# Patient Record
Sex: Female | Born: 2000
Health system: Southern US, Community
[De-identification: ages and names within clinical notes are randomized; demographics above are authoritative.]

## PROBLEM LIST (undated history)

## (undated) DIAGNOSIS — R0600 Dyspnea, unspecified: Secondary | ICD-10-CM

## (undated) DIAGNOSIS — F909 Attention-deficit hyperactivity disorder, unspecified type: Secondary | ICD-10-CM

## (undated) DIAGNOSIS — J45990 Exercise induced bronchospasm: Secondary | ICD-10-CM

## (undated) DIAGNOSIS — E049 Nontoxic goiter, unspecified: Secondary | ICD-10-CM

## (undated) DIAGNOSIS — R6250 Unspecified lack of expected normal physiological development in childhood: Secondary | ICD-10-CM

## (undated) DIAGNOSIS — F419 Anxiety disorder, unspecified: Secondary | ICD-10-CM

## (undated) DIAGNOSIS — F32A Depression, unspecified: Secondary | ICD-10-CM

## (undated) DIAGNOSIS — R630 Anorexia: Secondary | ICD-10-CM

## (undated) DIAGNOSIS — F329 Major depressive disorder, single episode, unspecified: Secondary | ICD-10-CM

## (undated) HISTORY — DX: Nontoxic goiter, unspecified: E04.9

## (undated) HISTORY — DX: Exercise induced bronchospasm: J45.990

## (undated) HISTORY — PX: WISDOM TOOTH EXTRACTION: SHX21

## (undated) HISTORY — DX: Depression, unspecified: F32.A

## (undated) HISTORY — DX: Anorexia: R63.0

## (undated) HISTORY — DX: Attention-deficit hyperactivity disorder, unspecified type: F90.9

## (undated) HISTORY — PX: ADENOIDECTOMY: SHX5191

## (undated) HISTORY — PX: LITHOTRIPSY: SUR834

## (undated) HISTORY — DX: Unspecified lack of expected normal physiological development in childhood: R62.50

## (undated) HISTORY — PX: TONSILLECTOMY AND ADENOIDECTOMY: SHX28

## (undated) HISTORY — DX: Major depressive disorder, single episode, unspecified: F32.9

## (undated) HISTORY — DX: Anxiety disorder, unspecified: F41.9

---
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
## 2022-01-02 DIAGNOSIS — F4325 Adjustment disorder with mixed disturbance of emotions and conduct: Secondary | ICD-10-CM | POA: Diagnosis not present

## 2022-01-16 DIAGNOSIS — F4325 Adjustment disorder with mixed disturbance of emotions and conduct: Secondary | ICD-10-CM | POA: Diagnosis not present

## 2022-01-18 ENCOUNTER — Other Ambulatory Visit (HOSPITAL_COMMUNITY): Payer: Self-pay

## 2022-01-18 MED ORDER — PROPRANOLOL HCL 10 MG PO TABS
10.0000 mg | ORAL_TABLET | Freq: Two times a day (BID) | ORAL | 1 refills | Status: AC
  Filled 2022-01-18: qty 180, 90d supply, fill #0
  Filled 2022-06-03: qty 180, 90d supply, fill #1
  Filled 2022-06-06: qty 60, 30d supply, fill #1

## 2022-01-18 MED ORDER — DEXTROAMPHETAMINE SULFATE ER 15 MG PO CP24
15.0000 mg | ORAL_CAPSULE | Freq: Every day | ORAL | 0 refills | Status: DC
  Filled 2022-04-05: qty 30, 30d supply, fill #0

## 2022-01-18 MED ORDER — DEXTROAMPHETAMINE SULFATE ER 15 MG PO CP24
15.0000 mg | ORAL_CAPSULE | Freq: Every day | ORAL | 0 refills | Status: DC
  Filled 2022-01-18: qty 30, 30d supply, fill #0

## 2022-01-18 MED ORDER — DEXTROAMPHETAMINE SULFATE ER 15 MG PO CP24
15.0000 mg | ORAL_CAPSULE | Freq: Every day | ORAL | 0 refills | Status: DC
  Filled 2022-02-20: qty 30, 30d supply, fill #0

## 2022-01-18 MED ORDER — AMPHETAMINE-DEXTROAMPHETAMINE 5 MG PO TABS
1.0000 | ORAL_TABLET | Freq: Every day | ORAL | 0 refills | Status: DC
  Filled 2022-04-05: qty 30, 30d supply, fill #0

## 2022-01-18 MED ORDER — AMPHETAMINE-DEXTROAMPHETAMINE 5 MG PO TABS
1.0000 | ORAL_TABLET | Freq: Every day | ORAL | 0 refills | Status: DC
  Filled 2022-01-18: qty 30, 30d supply, fill #0

## 2022-01-18 MED ORDER — AMPHETAMINE-DEXTROAMPHETAMINE 5 MG PO TABS: 1.0000 | ORAL_TABLET | Freq: Every day | ORAL | 0 refills | Status: DC

## 2022-01-23 DIAGNOSIS — F4325 Adjustment disorder with mixed disturbance of emotions and conduct: Secondary | ICD-10-CM | POA: Diagnosis not present

## 2022-02-01 DIAGNOSIS — F4325 Adjustment disorder with mixed disturbance of emotions and conduct: Secondary | ICD-10-CM | POA: Diagnosis not present

## 2022-02-06 DIAGNOSIS — F4325 Adjustment disorder with mixed disturbance of emotions and conduct: Secondary | ICD-10-CM | POA: Diagnosis not present

## 2022-02-13 DIAGNOSIS — F4325 Adjustment disorder with mixed disturbance of emotions and conduct: Secondary | ICD-10-CM | POA: Diagnosis not present

## 2022-02-18 DIAGNOSIS — F4325 Adjustment disorder with mixed disturbance of emotions and conduct: Secondary | ICD-10-CM | POA: Diagnosis not present

## 2022-02-20 ENCOUNTER — Other Ambulatory Visit (HOSPITAL_COMMUNITY): Payer: Self-pay

## 2022-02-20 DIAGNOSIS — F4325 Adjustment disorder with mixed disturbance of emotions and conduct: Secondary | ICD-10-CM | POA: Diagnosis not present

## 2022-02-27 DIAGNOSIS — F4325 Adjustment disorder with mixed disturbance of emotions and conduct: Secondary | ICD-10-CM | POA: Diagnosis not present

## 2022-03-06 DIAGNOSIS — F4325 Adjustment disorder with mixed disturbance of emotions and conduct: Secondary | ICD-10-CM | POA: Diagnosis not present

## 2022-03-13 DIAGNOSIS — F4325 Adjustment disorder with mixed disturbance of emotions and conduct: Secondary | ICD-10-CM | POA: Diagnosis not present

## 2022-03-20 DIAGNOSIS — F4325 Adjustment disorder with mixed disturbance of emotions and conduct: Secondary | ICD-10-CM | POA: Diagnosis not present

## 2022-03-27 DIAGNOSIS — F4325 Adjustment disorder with mixed disturbance of emotions and conduct: Secondary | ICD-10-CM | POA: Diagnosis not present

## 2022-04-03 DIAGNOSIS — F4325 Adjustment disorder with mixed disturbance of emotions and conduct: Secondary | ICD-10-CM | POA: Diagnosis not present

## 2022-04-05 ENCOUNTER — Other Ambulatory Visit (HOSPITAL_COMMUNITY): Payer: Self-pay

## 2022-04-05 MED ORDER — DEXTROAMPHETAMINE SULFATE ER 15 MG PO CP24
15.0000 mg | ORAL_CAPSULE | Freq: Every day | ORAL | 0 refills | Status: DC
  Filled 2022-07-08: qty 30, 30d supply, fill #0

## 2022-04-05 MED ORDER — DEXTROAMPHETAMINE SULFATE ER 15 MG PO CP24
15.0000 mg | ORAL_CAPSULE | Freq: Every day | ORAL | 0 refills | Status: DC
  Filled 2022-05-03: qty 30, 30d supply, fill #0

## 2022-04-05 MED ORDER — DEXTROAMPHETAMINE SULFATE ER 15 MG PO CP24
15.0000 mg | ORAL_CAPSULE | Freq: Every day | ORAL | 0 refills | Status: DC
  Filled 2022-06-03 – 2022-06-06 (×2): qty 30, 30d supply, fill #0

## 2022-04-05 MED ORDER — AMPHETAMINE-DEXTROAMPHETAMINE 5 MG PO TABS
1.0000 | ORAL_TABLET | Freq: Every day | ORAL | 0 refills | Status: DC
  Filled 2022-06-03 – 2022-06-06 (×2): qty 30, 30d supply, fill #0

## 2022-04-05 MED ORDER — AMPHETAMINE-DEXTROAMPHETAMINE 5 MG PO TABS: 1.0000 | ORAL_TABLET | Freq: Every day | ORAL | 0 refills | Status: DC

## 2022-04-05 MED ORDER — AMPHETAMINE-DEXTROAMPHETAMINE 5 MG PO TABS
1.0000 | ORAL_TABLET | Freq: Every day | ORAL | 0 refills | Status: DC
  Filled 2022-07-08: qty 30, 30d supply, fill #0

## 2022-04-08 ENCOUNTER — Other Ambulatory Visit (HOSPITAL_COMMUNITY): Payer: Self-pay

## 2022-04-09 ENCOUNTER — Other Ambulatory Visit (HOSPITAL_COMMUNITY): Payer: Self-pay

## 2022-04-10 DIAGNOSIS — R300 Dysuria: Secondary | ICD-10-CM | POA: Diagnosis not present

## 2022-04-10 DIAGNOSIS — N390 Urinary tract infection, site not specified: Secondary | ICD-10-CM | POA: Diagnosis not present

## 2022-04-10 DIAGNOSIS — F4325 Adjustment disorder with mixed disturbance of emotions and conduct: Secondary | ICD-10-CM | POA: Diagnosis not present

## 2022-04-10 DIAGNOSIS — N3001 Acute cystitis with hematuria: Secondary | ICD-10-CM | POA: Diagnosis not present

## 2022-04-10 DIAGNOSIS — Z1331 Encounter for screening for depression: Secondary | ICD-10-CM | POA: Diagnosis not present

## 2022-04-26 DIAGNOSIS — F4325 Adjustment disorder with mixed disturbance of emotions and conduct: Secondary | ICD-10-CM | POA: Diagnosis not present

## 2022-05-01 DIAGNOSIS — F4325 Adjustment disorder with mixed disturbance of emotions and conduct: Secondary | ICD-10-CM | POA: Diagnosis not present

## 2022-05-02 ENCOUNTER — Other Ambulatory Visit (HOSPITAL_COMMUNITY): Payer: Self-pay

## 2022-05-03 ENCOUNTER — Other Ambulatory Visit (HOSPITAL_COMMUNITY): Payer: Self-pay

## 2022-05-06 DIAGNOSIS — R109 Unspecified abdominal pain: Secondary | ICD-10-CM | POA: Diagnosis not present

## 2022-05-06 DIAGNOSIS — Z79899 Other long term (current) drug therapy: Secondary | ICD-10-CM | POA: Diagnosis not present

## 2022-05-06 DIAGNOSIS — R319 Hematuria, unspecified: Secondary | ICD-10-CM | POA: Diagnosis not present

## 2022-05-06 DIAGNOSIS — N200 Calculus of kidney: Secondary | ICD-10-CM | POA: Diagnosis not present

## 2022-05-06 DIAGNOSIS — N390 Urinary tract infection, site not specified: Secondary | ICD-10-CM | POA: Diagnosis not present

## 2022-05-08 DIAGNOSIS — F4325 Adjustment disorder with mixed disturbance of emotions and conduct: Secondary | ICD-10-CM | POA: Diagnosis not present

## 2022-05-17 DIAGNOSIS — R310 Gross hematuria: Secondary | ICD-10-CM | POA: Diagnosis not present

## 2022-05-17 DIAGNOSIS — R1084 Generalized abdominal pain: Secondary | ICD-10-CM | POA: Diagnosis not present

## 2022-05-17 DIAGNOSIS — N200 Calculus of kidney: Secondary | ICD-10-CM | POA: Diagnosis not present

## 2022-05-17 DIAGNOSIS — R8271 Bacteriuria: Secondary | ICD-10-CM | POA: Diagnosis not present

## 2022-05-22 ENCOUNTER — Other Ambulatory Visit: Payer: Self-pay

## 2022-05-22 ENCOUNTER — Other Ambulatory Visit (HOSPITAL_COMMUNITY): Payer: Self-pay

## 2022-05-22 DIAGNOSIS — N200 Calculus of kidney: Secondary | ICD-10-CM | POA: Diagnosis not present

## 2022-05-22 MED ORDER — CEPHALEXIN 500 MG PO CAPS
500.0000 mg | ORAL_CAPSULE | Freq: Three times a day (TID) | ORAL | 0 refills | Status: DC
  Filled 2022-05-22 (×2): qty 21, 7d supply, fill #0

## 2022-05-22 MED ORDER — TAMSULOSIN HCL 0.4 MG PO CAPS
0.4000 mg | ORAL_CAPSULE | Freq: Every day | ORAL | 1 refills | Status: DC
  Filled 2022-05-22 (×2): qty 15, 15d supply, fill #0

## 2022-05-22 MED ORDER — HYDROCODONE-ACETAMINOPHEN 5-325 MG PO TABS
1.0000 | ORAL_TABLET | Freq: Four times a day (QID) | ORAL | 0 refills | Status: DC | PRN
  Filled 2022-05-22 (×2): qty 10, 3d supply, fill #0

## 2022-05-24 ENCOUNTER — Other Ambulatory Visit (HOSPITAL_COMMUNITY): Payer: Self-pay

## 2022-05-24 MED ORDER — HYDROCODONE-ACETAMINOPHEN 5-325 MG PO TABS
1.0000 | ORAL_TABLET | Freq: Four times a day (QID) | ORAL | 0 refills | Status: DC | PRN
  Filled 2022-05-24: qty 20, 5d supply, fill #0

## 2022-05-29 ENCOUNTER — Other Ambulatory Visit: Payer: Self-pay

## 2022-05-29 ENCOUNTER — Emergency Department (HOSPITAL_COMMUNITY)
Admission: EM | Admit: 2022-05-29 | Discharge: 2022-05-29 | Disposition: A | Discharge status: Home / Self Care | Payer: 59 | Attending: Emergency Medicine | Admitting: Emergency Medicine

## 2022-05-29 DIAGNOSIS — R064 Hyperventilation: Secondary | ICD-10-CM | POA: Diagnosis not present

## 2022-05-29 DIAGNOSIS — R1030 Lower abdominal pain, unspecified: Secondary | ICD-10-CM | POA: Insufficient documentation

## 2022-05-29 DIAGNOSIS — R339 Retention of urine, unspecified: Secondary | ICD-10-CM | POA: Diagnosis not present

## 2022-05-29 DIAGNOSIS — N200 Calculus of kidney: Secondary | ICD-10-CM | POA: Diagnosis not present

## 2022-05-29 LAB — CBC WITH DIFFERENTIAL/PLATELET
Abs Immature Granulocytes: 0.05 10*3/uL (ref 0.00–0.07)
Basophils Absolute: 0 10*3/uL (ref 0.0–0.1)
Basophils Relative: 0 %
Eosinophils Absolute: 0.3 10*3/uL (ref 0.0–0.5)
Eosinophils Relative: 3 %
HCT: 32.8 % — ABNORMAL LOW (ref 36.0–46.0)
Hemoglobin: 10.8 g/dL — ABNORMAL LOW (ref 12.0–15.0)
Immature Granulocytes: 0 %
Lymphocytes Relative: 14 %
Lymphs Abs: 1.6 10*3/uL (ref 0.7–4.0)
MCH: 29.8 pg (ref 26.0–34.0)
MCHC: 32.9 g/dL (ref 30.0–36.0)
MCV: 90.4 fL (ref 80.0–100.0)
Monocytes Absolute: 1.1 10*3/uL — ABNORMAL HIGH (ref 0.1–1.0)
Monocytes Relative: 9 %
Neutro Abs: 8.7 10*3/uL — ABNORMAL HIGH (ref 1.7–7.7)
Neutrophils Relative %: 74 %
Platelets: 345 10*3/uL (ref 150–400)
RBC: 3.63 MIL/uL — ABNORMAL LOW (ref 3.87–5.11)
RDW: 11.9 % (ref 11.5–15.5)
WBC: 11.9 10*3/uL — ABNORMAL HIGH (ref 4.0–10.5)
nRBC: 0 % (ref 0.0–0.2)

## 2022-05-29 LAB — URINALYSIS, ROUTINE W REFLEX MICROSCOPIC
Bacteria, UA: NONE SEEN
RBC / HPF: >50 RBC/hpf — ABNORMAL HIGH (ref 0–5)
WBC, UA: >50 WBC/hpf — ABNORMAL HIGH (ref 0–5)

## 2022-05-29 LAB — BASIC METABOLIC PANEL
Anion gap: 8 (ref 5–15)
BUN: 15 mg/dL (ref 6–20)
CO2: 23 mmol/L (ref 22–32)
Calcium: 8.7 mg/dL — ABNORMAL LOW (ref 8.9–10.3)
Chloride: 102 mmol/L (ref 98–111)
Creatinine, Ser: 0.63 mg/dL (ref 0.44–1.00)
GFR, Estimated: >60 mL/min (ref >60)
Glucose, Bld: 104 mg/dL — ABNORMAL HIGH (ref 70–99)
Potassium: 3.3 mmol/L — ABNORMAL LOW (ref 3.5–5.1)
Sodium: 133 mmol/L — ABNORMAL LOW (ref 135–145)

## 2022-05-29 MED ORDER — OXYCODONE HCL 5 MG PO TABS
5.0000 mg | ORAL_TABLET | Freq: Once | ORAL | Status: AC
  Administered 2022-05-29: 5 mg via ORAL
  Filled 2022-05-29: qty 1

## 2022-05-29 MED ORDER — LORAZEPAM 1 MG PO TABS: 1.0000 mg | ORAL_TABLET | Freq: Once | ORAL | Status: DC

## 2022-05-29 MED ORDER — SULFAMETHOXAZOLE-TRIMETHOPRIM 800-160 MG PO TABS: 1.0000 | ORAL_TABLET | Freq: Two times a day (BID) | ORAL | 0 refills | Status: AC

## 2022-05-29 MED ORDER — SULFAMETHOXAZOLE-TRIMETHOPRIM 800-160 MG PO TABS
1.0000 | ORAL_TABLET | Freq: Once | ORAL | Status: AC
  Administered 2022-05-29: 1 via ORAL
  Filled 2022-05-29: qty 1

## 2022-05-29 NOTE — ED Triage Notes (Signed)
Pt BIB EMS from home c/o urinary retention. Pt had stents placed for kidney stones last week. C/o lower abd pain   42mcg Fentanyl IM

## 2022-05-29 NOTE — Discharge Instructions (Addendum)
The remainder of the antibiotics are at your pharmacy on file.  Please follow-up with Dr. Gloriann Loan as planned tomorrow. It was a pleasure to meet you and we hope you feel better!

## 2022-05-29 NOTE — ED Provider Notes (Signed)
[facility] DEPT Provider Note   CSN: [id number] Arrival date & time: 05/29/22  1645     History  Chief Complaint  Patient presents with   Urinary Retention    Faith Shaw is a 22 y.o. female presenting today with urinary retention.  She reports having bilateral stents placed secondary to nephrolithiasis last week and she is supposed to see urology to have these removed tomorrow.  She has not been able to urinate since midnight and is complaining of very severe suprapubic pain.  Reports pain is the worst pain she is ever been in and that is worse than the kidney stones and cells.  No nausea, vomiting or diarrhea.  No fevers or chills.  HPI     Home Medications Prior to Admission medications   Medication Sig Start Date End Date Taking? Authorizing Provider  amphetamine-dextroamphetamine (ADDERALL XR) 15 MG 24 hr capsule Take 1 capsule by mouth once a day*09/11/21 09/11/21     amphetamine-dextroamphetamine (ADDERALL XR) 15 MG 24 hr capsule Take 1 capsule by mouth once a day 08/14/21     amphetamine-dextroamphetamine (ADDERALL) 5 MG tablet Take 1 tablet by mouth once a day in early afternoon if needed 08/14/21     amphetamine-dextroamphetamine (ADDERALL) 5 MG tablet Take 1 tablet by mouth once a day in early afternoon if needed*09/11/21 09/11/21     amphetamine-dextroamphetamine (ADDERALL) 5 MG tablet Take 1 tablet by mouth once a day in early afternoon if needed 07/17/21     amphetamine-dextroamphetamine (ADDERALL) 5 MG tablet Take 1 tablet by mouth once a day*02/15/22 02/15/22     amphetamine-dextroamphetamine (ADDERALL) 5 MG tablet Take 1 tablet by mouth once a day*03/15/22 03/15/22     amphetamine-dextroamphetamine (ADDERALL) 5 MG tablet Take 1 tablet by mouth once a day 01/18/22     amphetamine-dextroamphetamine (ADDERALL) 5 MG tablet Take 1 tablet by mouth once a day 05/31/22     amphetamine-dextroamphetamine (ADDERALL) 5 MG tablet Take 1 tablet by mouth  once a day 06/28/22     amphetamine-dextroamphetamine (ADDERALL) 5 MG tablet Take 1 tablet by mouth once a day 05/03/22     cephALEXin (KEFLEX) 500 MG capsule Take 1 capsule (500 mg total) by mouth 3 (three) times daily. 05/22/22     cetirizine (ZYRTEC) 10 MG tablet Take 10 mg by mouth as needed for allergies.     [provider]  cloNIDine (CATAPRES) 0.1 MG tablet Take 2 tablets by mouth every morning 05/30/21     cloNIDine HCl (KAPVAY) 0.1 MG TB12 ER tablet TAKE 1 TABLET BY MOUTH EACH MORNING 09/20/20   Ethelda Chick, MD  CYRED EQ 0.15-30 MG-MCG tablet Take 1 tablet by mouth daily. Patient not taking: Reported on 01/01/2021 11/12/19   [provider]  dextroamphetamine (DEXEDRINE SPANSULE) 15 MG 24 hr capsule Take 1 capsule (15 mg total) by mouth every morning. (7/29) Patient not taking: Reported on 01/01/2021 12/13/20 01/22/21  Flossie Buffy, NP  dextroamphetamine (DEXEDRINE SPANSULE) 15 MG 24 hr capsule Take 1 capsule by mouth every morning 04/25/21     dextroamphetamine (DEXEDRINE SPANSULE) 15 MG 24 hr capsule Take 1 capsule by mouth every morning 05/30/21     dextroamphetamine (DEXEDRINE SPANSULE) 15 MG 24 hr capsule Take 1 capsule by mouth every morning 07/17/21     dextroamphetamine (DEXEDRINE SPANSULE) 15 MG 24 hr capsule Take 1 capsule by mouth once a day 01/18/22     dextroamphetamine (DEXEDRINE SPANSULE) 15 MG 24 hr capsule Take 1  capsule by mouth once a day*03/15/22 03/15/22     dextroamphetamine (DEXEDRINE SPANSULE) 15 MG 24 hr capsule Take 1 capsule by mouth once a day*02/15/22 02/15/22     dextroamphetamine (DEXEDRINE SPANSULE) 15 MG 24 hr capsule Take 1 capsule by mouth once a day 06/28/22     dextroamphetamine (DEXEDRINE SPANSULE) 15 MG 24 hr capsule Take 1 capsule by mouth once a day 05/31/22     dextroamphetamine (DEXEDRINE SPANSULE) 15 MG 24 hr capsule Take 1 capsule by mouth once a day 05/03/22     dextroamphetamine (DEXEDRINE) 15 MG 24 hr capsule Take 1 capsule by mouth  once a day 02/14/21     dextroamphetamine (DEXEDRINE) 15 MG 24 hr capsule Take 1 capsule by mouth once a day 07/03/21     dextroamphetamine (DEXEDRINE) 15 MG 24 hr capsule Take 1 capsule by mouth once a day *10/25/2021 10/25/21     dextroamphetamine (DEXEDRINE) 15 MG 24 hr capsule Take 1 capsule by mouth once a day*12/25/21 12/25/21     dextroamphetamine (DEXEDRINE) 15 MG 24 hr capsule Take 1 capsule by mouth once a day 11/24/21     dextroamphetamine (DEXEDRINE) 15 MG 24 hr capsule Take 1 capsule by mouth once a day 09/28/21     dextroamphetamine (DEXTROSTAT) 5 MG tablet Take 1 tablet by mouth every evening as needed take prior to night time college classes 02/14/21     dextroamphetamine (DEXTROSTAT) 5 MG tablet Take 1 tablet by mouth every evening as needed take prior to night time college classes (fill 03/16/21) 03/16/21     dextroamphetamine (DEXTROSTAT) 5 MG tablet Take 1 tablet by mouth every evening as needed prior to night time college classes 05/30/21     dextroamphetamine (DEXTROSTAT) 5 MG tablet Take 1 tablet by mouth every evening as needed take prior to night time college classes 07/03/21     drospirenone-ethinyl estradiol (YAZ) 3-0.02 MG tablet Take 1 tablet by mouth daily. 11/14/21     EPIDUO FORTE 0.3-2.5 % GEL Apply 1 application topically daily. Patient not taking: Reported on 01/01/2021 11/12/19   [provider]  HYDROcodone-acetaminophen (NORCO/VICODIN) 5-325 MG tablet Take 1 tablet by mouth every 6 (six) hours as needed for pain. 05/23/22   Crista Elliot, MD  propranolol (INDERAL) 10 MG tablet Take 1 tablet by mouth twice a day may take additional 1-2 tablets every 8 hours as needed for anxiety. 09/17/21     propranolol (INDERAL) 10 MG tablet Take 1 tablet by mouth twice a day . 01/18/22     tamsulosin (FLOMAX) 0.4 MG CAPS capsule Take 1 capsule (0.4 mg total) by mouth daily. 05/22/22         Allergies    Patient has no known allergies.    Review of Systems   Review of  Systems  Physical Exam Updated Vital Signs BP (!) 146/94   Pulse 100   Temp 98 F (36.7 C) (Oral)   Ht 5' (1.524 m)   Wt 50 kg   SpO2 98%   BMI 21.53 kg/m  Physical Exam Vitals and nursing note reviewed.  Constitutional:      General: She is in acute distress.     Appearance: Normal appearance. She is not ill-appearing.  HENT:     Head: Normocephalic and atraumatic.  Eyes:     General: No scleral icterus.    Conjunctiva/sclera: Conjunctivae normal.  Pulmonary:     Effort: Pulmonary effort is normal. No respiratory distress.  Abdominal:  General: Abdomen is flat.     Palpations: Abdomen is soft.     Tenderness: There is abdominal tenderness (Very severe suprapubic tenderness).  Skin:    Findings: No rash.  Neurological:     Mental Status: She is alert.  Psychiatric:        Mood and Affect: Mood normal.     ED Results / Procedures / Treatments   Labs (all labs ordered are listed, but only abnormal results are displayed) Labs Reviewed  CBC WITH DIFFERENTIAL/PLATELET - Abnormal; Notable for the following components:      Result Value   WBC 11.9 (*)    RBC 3.63 (*)    Hemoglobin 10.8 (*)    HCT 32.8 (*)    Neutro Abs 8.7 (*)    Monocytes Absolute 1.1 (*)    All other components within normal limits  BASIC METABOLIC PANEL - Abnormal; Notable for the following components:   Sodium 133 (*)    Potassium 3.3 (*)    Glucose, Bld 104 (*)    Calcium 8.7 (*)    All other components within normal limits  URINE CULTURE  URINALYSIS, ROUTINE W REFLEX MICROSCOPIC    EKG None  Radiology No results found.  Bladder scanner 498 cc  Procedures Procedures   Medications Ordered in ED Medications  oxyCODONE (Oxy IR/ROXICODONE) immediate release tablet 5 mg (has no administration in time range)    ED Course/ Medical Decision Making/ A&P Clinical Course as of 05/29/22 1828  Wed May 29, 2022  1802 Foley placed with some bloody urinary return. 5 minutes later she  was found incontinent of bloody fluid. Minimal bag drainage. Spoke with NP who recommends bladder US for better evaluation than bladder scanner. Radiology unable to do this scan at this time. Dr. Pete Glatter with urology suggests an 57 or 20 french. And re-evaluation [MR]    Clinical Course User Index [MR] Luby Seamans, Gabriel Cirri, PA-C                           Medical Decision Making Amount and/or Complexity of Data Reviewed Labs: ordered.  Risk Prescription drug management.     Past Medical History / Co-morbidities / Social History: nephrolithiasis, recent bilateral stent placement   Additional history: Unable to review the outpatient urology records   Physical Exam: Pertinent physical exam findings include Severe suprapubic tenderness  Lab Tests: I ordered, and personally interpreted labs.  The pertinent results include: Normal kidney function   Imaging Studies: Bedside bladder scanner with 500 cc, second with 470cc after patient had an episode of incontinence on the stretcher.   Medications: Oxycodone which relieved patient's pain   Consultations Obtained: I spoke with Dr. Pete Glatter with urology.  He recommends using an 18 or 20-French Foley instead of the 16 that we placed.  If this relieves her symptoms then she will be able to follow-up outpatient, otherwise he will come and evaluate.   MDM/Disposition: This is a 22 year old female presenting today with urinary retention since midnight.  She had stents placed due to bilateral ureterolithiasis last week.  She is supposed to have her stents removed tomorrow but could not wait due to the pain and retention.  Initially had a 16 French catheter placed which did not put out very much urine.  I consulted urology who suggested an 18 or 20.  18 was placed and over 900 cc were drained from the patient's bladder.  She felt immediate relief.  I have talked to Dr. Felipa Eth once more who suggested placing the patient on Bactrim, sending  off a urine sample and discharging her home with the Foley to follow-up with Dr. Gloriann Loan.  Patient and her mother are agreeable to this plan.  Given her first dose of antibiotics in the department and will be discharged home at this time.    Final Clinical Impression(s) / ED Diagnoses Final diagnoses:  Urinary retention    Rx / DC Orders ED Discharge Orders          Ordered    sulfamethoxazole-trimethoprim (BACTRIM DS) 800-160 MG tablet  2 times daily        05/29/22 1847           Results and diagnoses were explained to the patient. Return precautions discussed in full. Patient had no additional questions and expressed complete understanding.   This chart was dictated using voice recognition software.  Despite best efforts to proofread,  errors can occur which can change the documentation meaning.    Darliss Ridgel 05/29/22 1911    Dorie Rank, MD 05/30/22 1015

## 2022-05-30 ENCOUNTER — Encounter (HOSPITAL_COMMUNITY)
Admission: RE | Disposition: A | Discharge status: Home / Self Care | Payer: Self-pay | Source: Ambulatory Visit | Attending: Urology

## 2022-05-30 ENCOUNTER — Ambulatory Visit (HOSPITAL_COMMUNITY): Payer: 59 | Admitting: Certified Registered Nurse Anesthetist

## 2022-05-30 ENCOUNTER — Ambulatory Visit (HOSPITAL_COMMUNITY)
Admission: RE | Admit: 2022-05-30 | Discharge: 2022-05-30 | Disposition: A | Discharge status: Home / Self Care | Payer: 59 | Source: Ambulatory Visit | Attending: Urology | Admitting: Urology

## 2022-05-30 ENCOUNTER — Other Ambulatory Visit: Payer: Self-pay | Admitting: Urology

## 2022-05-30 ENCOUNTER — Encounter (HOSPITAL_COMMUNITY): Payer: Self-pay | Admitting: Urology

## 2022-05-30 ENCOUNTER — Ambulatory Visit (HOSPITAL_BASED_OUTPATIENT_CLINIC_OR_DEPARTMENT_OTHER): Payer: 59 | Admitting: Certified Registered Nurse Anesthetist

## 2022-05-30 ENCOUNTER — Ambulatory Visit (HOSPITAL_COMMUNITY): Payer: 59

## 2022-05-30 DIAGNOSIS — D649 Anemia, unspecified: Secondary | ICD-10-CM | POA: Diagnosis not present

## 2022-05-30 DIAGNOSIS — Z466 Encounter for fitting and adjustment of urinary device: Secondary | ICD-10-CM | POA: Diagnosis not present

## 2022-05-30 DIAGNOSIS — R310 Gross hematuria: Secondary | ICD-10-CM | POA: Diagnosis not present

## 2022-05-30 DIAGNOSIS — D759 Disease of blood and blood-forming organs, unspecified: Secondary | ICD-10-CM | POA: Insufficient documentation

## 2022-05-30 DIAGNOSIS — Z01818 Encounter for other preprocedural examination: Secondary | ICD-10-CM

## 2022-05-30 HISTORY — DX: Dyspnea, unspecified: R06.00

## 2022-05-30 HISTORY — PX: CYSTOSCOPY WITH RETROGRADE PYELOGRAM, URETEROSCOPY AND STENT PLACEMENT: SHX5789

## 2022-05-30 LAB — URINE CULTURE: Culture: NO GROWTH

## 2022-05-30 LAB — POCT PREGNANCY, URINE: Preg Test, Ur: NEGATIVE

## 2022-05-30 SURGERY — CYSTOURETEROSCOPY, WITH RETROGRADE PYELOGRAM AND STENT INSERTION
Anesthesia: General | Laterality: Bilateral

## 2022-05-30 MED ORDER — PROPOFOL 10 MG/ML IV BOLUS
INTRAVENOUS | Status: DC | PRN
  Administered 2022-05-30: 50 mg via INTRAVENOUS
  Administered 2022-05-30: 150 mg via INTRAVENOUS

## 2022-05-30 MED ORDER — CEFAZOLIN SODIUM-DEXTROSE 2-4 GM/100ML-% IV SOLN
INTRAVENOUS | Status: AC
  Filled 2022-05-30: qty 100

## 2022-05-30 MED ORDER — HYDROCODONE-ACETAMINOPHEN 5-325 MG PO TABS: 1.0000 | ORAL_TABLET | Freq: Four times a day (QID) | ORAL | 0 refills | Status: DC | PRN

## 2022-05-30 MED ORDER — ONDANSETRON HCL 4 MG/2ML IJ SOLN
INTRAMUSCULAR | Status: AC
  Filled 2022-05-30: qty 2

## 2022-05-30 MED ORDER — DEXAMETHASONE SODIUM PHOSPHATE 10 MG/ML IJ SOLN
INTRAMUSCULAR | Status: DC | PRN
  Administered 2022-05-30: 4 mg via INTRAVENOUS

## 2022-05-30 MED ORDER — LIDOCAINE HCL (PF) 2 % IJ SOLN
INTRAMUSCULAR | Status: AC
  Filled 2022-05-30: qty 5

## 2022-05-30 MED ORDER — CEFAZOLIN SODIUM-DEXTROSE 2-4 GM/100ML-% IV SOLN
2.0000 g | INTRAVENOUS | Status: AC
  Administered 2022-05-30: 2 g via INTRAVENOUS

## 2022-05-30 MED ORDER — ORAL CARE MOUTH RINSE: 15.0000 mL | Freq: Once | OROMUCOSAL | Status: DC

## 2022-05-30 MED ORDER — MIDAZOLAM HCL 2 MG/2ML IJ SOLN
INTRAMUSCULAR | Status: DC | PRN
  Administered 2022-05-30: 2 mg via INTRAVENOUS

## 2022-05-30 MED ORDER — ONDANSETRON HCL 4 MG/2ML IJ SOLN
INTRAMUSCULAR | Status: DC | PRN
  Administered 2022-05-30: 4 mg via INTRAVENOUS

## 2022-05-30 MED ORDER — FENTANYL CITRATE (PF) 100 MCG/2ML IJ SOLN
INTRAMUSCULAR | Status: AC
  Filled 2022-05-30: qty 2

## 2022-05-30 MED ORDER — DEXAMETHASONE SODIUM PHOSPHATE 10 MG/ML IJ SOLN
INTRAMUSCULAR | Status: AC
  Filled 2022-05-30: qty 1

## 2022-05-30 MED ORDER — SODIUM CHLORIDE 0.9 % IR SOLN
Status: DC | PRN
  Administered 2022-05-30: 3000 mL

## 2022-05-30 MED ORDER — LIDOCAINE 2% (20 MG/ML) 5 ML SYRINGE
INTRAMUSCULAR | Status: DC | PRN
  Administered 2022-05-30: 40 mg via INTRAVENOUS

## 2022-05-30 MED ORDER — FENTANYL CITRATE (PF) 100 MCG/2ML IJ SOLN
INTRAMUSCULAR | Status: DC | PRN
  Administered 2022-05-30: 50 ug via INTRAVENOUS

## 2022-05-30 MED ORDER — IOHEXOL 300 MG/ML  SOLN
INTRAMUSCULAR | Status: DC | PRN
  Administered 2022-05-30: 50 mL

## 2022-05-30 MED ORDER — MIDAZOLAM HCL 2 MG/2ML IJ SOLN
INTRAMUSCULAR | Status: AC
  Filled 2022-05-30: qty 2

## 2022-05-30 MED ORDER — LACTATED RINGERS IV SOLN: INTRAVENOUS | Status: DC

## 2022-05-30 MED ORDER — PHENYLEPHRINE 80 MCG/ML (10ML) SYRINGE FOR IV PUSH (FOR BLOOD PRESSURE SUPPORT)
PREFILLED_SYRINGE | INTRAVENOUS | Status: DC | PRN
  Administered 2022-05-30 (×6): 80 ug via INTRAVENOUS

## 2022-05-30 MED ORDER — CHLORHEXIDINE GLUCONATE 0.12 % MT SOLN: 15.0000 mL | Freq: Once | OROMUCOSAL | Status: DC

## 2022-05-30 MED ORDER — 0.9 % SODIUM CHLORIDE (POUR BTL) OPTIME
TOPICAL | Status: DC | PRN
  Administered 2022-05-30: 1000 mL

## 2022-05-30 SURGICAL SUPPLY — 24 items
BAG URO CATCHER STRL LF (MISCELLANEOUS) ×2 IMPLANT
BASKET LASER NITINOL 1.9FR (BASKET) IMPLANT
BASKET ZERO TIP NITINOL 2.4FR (BASKET) IMPLANT
BSKT STON RTRVL 120 1.9FR (BASKET)
BSKT STON RTRVL ZERO TP 2.4FR (BASKET)
CATH URETERAL DUAL LUMEN 10F (MISCELLANEOUS) IMPLANT
CATH URETL OPEN END 6FR 70 (CATHETERS) ×2 IMPLANT
CLOTH BEACON ORANGE TIMEOUT ST (SAFETY) ×2 IMPLANT
EXTRACTOR STONE 1.7FRX115CM (UROLOGICAL SUPPLIES) IMPLANT
GLOVE BIO SURGEON STRL SZ7.5 (GLOVE) ×2 IMPLANT
GOWN STRL REUS W/ TWL XL LVL3 (GOWN DISPOSABLE) ×2 IMPLANT
GOWN STRL REUS W/TWL XL LVL3 (GOWN DISPOSABLE) ×1
GUIDEWIRE ANG ZIPWIRE 038X150 (WIRE) IMPLANT
GUIDEWIRE STR DUAL SENSOR (WIRE) ×2 IMPLANT
KIT TURNOVER KIT A (KITS) IMPLANT
LASER FIB FLEXIVA PULSE ID 365 (Laser) IMPLANT
MANIFOLD NEPTUNE II (INSTRUMENTS) ×2 IMPLANT
PACK CYSTO (CUSTOM PROCEDURE TRAY) ×2 IMPLANT
SHEATH NAVIGATOR HD 11/13X28 (SHEATH) IMPLANT
SHEATH NAVIGATOR HD 11/13X36 (SHEATH) IMPLANT
TRACTIP FLEXIVA PULS ID 200XHI (Laser) IMPLANT
TRACTIP FLEXIVA PULSE ID 200 (Laser)
TUBING CONNECTING 10 (TUBING) ×2 IMPLANT
TUBING UROLOGY SET (TUBING) ×2 IMPLANT

## 2022-05-30 NOTE — Op Note (Signed)
Operative Note  Preoperative diagnosis:  1.  Gross hematuria with clot retention 2.  Bilateral retained ureteral stents  Postoperative diagnosis: 1.  Same  Procedure(s): 1.  Cystoscopy with clot evacuation 2.  Bilateral ureteral stent removal, simple 3.  Bilateral retrograde pyelogram  Surgeon: Link Snuffer, MD  Assistants: None  Anesthesia: General  Complications: None immediate  EBL: Minimal  Specimens: 1.  None  Drains/Catheters: 1.  None  Intraoperative findings: 1.  Normal urethra 2.  Small amount of clot in the bladder that was evacuated. 3.  Bladder mucosa with some mild edema from the stents.  No active bleeding from the bladder mucosa. 4.  Bilateral retrograde pyelogram revealed no hydronephrosis and no filling defects in the ureters.  There was no extravasation of contrast.  Indication: 22 year old female status post bilateral ureteroscopy with laser lithotripsy and ureteral stent placement bilaterally.  Yesterday, she developed urinary retention requiring emergency room visit and Foley catheter placement.  Continues to have gross hematuria.  She presents for the previously mentioned operation.  Description of procedure:  The patient was identified and consent was obtained.  The patient was taken to the operating room and placed in the supine position.  The patient was placed under general anesthesia.  Perioperative antibiotics were administered.  The patient was placed in dorsal lithotomy.  Patient was prepped and draped in a standard sterile fashion and a timeout was performed.  A 21 French rigid cystoscope was advanced into the urethra and into the bladder.  Clot was evacuated through the scope.  Bladder mucosa was then evaluated with findings noted above.  Bilateral stents were then removed.  Each ureter was then intubated with an open-ended ureteral catheter and retrograde pyelogram was performed with no significant abnormal findings noted.  Therefore, left her  without stents and did not exchange them.  Plan: Patient will follow-up in 1 month with renal ultrasound and KUB

## 2022-05-30 NOTE — Anesthesia Procedure Notes (Signed)
Procedure Name: LMA Insertion Date/Time: 05/30/2022 5:31 PM  Performed by: Eben Burow, CRNAPre-anesthesia Checklist: Patient identified, Emergency Drugs available, Suction available, Patient being monitored and Timeout performed Patient Re-evaluated:Patient Re-evaluated prior to induction Oxygen Delivery Method: Circle system utilized Preoxygenation: Pre-oxygenation with 100% oxygen Induction Type: IV induction Ventilation: Mask ventilation without difficulty LMA: LMA inserted and LMA with gastric port inserted LMA Size: 3.0 Number of attempts: 1 Tube secured with: Tape Dental Injury: Teeth and Oropharynx as per pre-operative assessment

## 2022-05-30 NOTE — Anesthesia Postprocedure Evaluation (Signed)
Anesthesia Post Note  Patient: Faith Shaw  Procedure(s) Performed: CYSTOSCOPY WITH RETROGRADE PYELOGRAM, STENT REMOVAL (Bilateral)     Patient location during evaluation: PACU Anesthesia Type: General Level of consciousness: awake Pain management: pain level controlled Vital Signs Assessment: post-procedure vital signs reviewed and stable Respiratory status: spontaneous breathing Cardiovascular status: stable Postop Assessment: no apparent nausea or vomiting Anesthetic complications: no  No notable events documented.  Last Vitals:  Vitals:   05/30/22 1815 05/30/22 1830  BP: 113/67 107/76  Pulse: 94 100  Resp: 15 14  Temp: 36.7 C   SpO2: 100% 100%    Last Pain:  Vitals:   05/30/22 1830  TempSrc:   PainSc: 0-No pain                 Huston Foley

## 2022-05-30 NOTE — Transfer of Care (Signed)
Immediate Anesthesia Transfer of Care Note  Patient: Faith Shaw  Procedure(s) Performed: CYSTOSCOPY WITH RETROGRADE PYELOGRAM, STENT REMOVAL (Bilateral)  Patient Location: PACU  Anesthesia Type:General  Level of Consciousness: awake, alert , and patient cooperative  Airway & Oxygen Therapy: Patient Spontanous Breathing and Patient connected to face mask oxygen  Post-op Assessment: Report given to RN and Post -op Vital signs reviewed and stable  Post vital signs: Reviewed and stable  Last Vitals:  Vitals Value Taken Time  BP 109/73 05/30/22 1809  Temp    Pulse 102 05/30/22 1810  Resp 14 05/30/22 1810  SpO2 100 % 05/30/22 1810  Vitals shown include unvalidated device data.  Last Pain:  Vitals:   05/30/22 1628  TempSrc:   PainSc: 3       Patients Stated Pain Goal: 4 (15/05/69 7948)  Complications: No notable events documented.

## 2022-05-30 NOTE — H&P (Signed)
H&P  Chief Complaint: Gross hematuria, retained ureteral stents  History of Present Illness: 22 year old female status post bilateral ureteroscopy with laser lithotripsy and ureteral stent placement bilaterally.  Has been having some pain discomfort but not all unexpected from typical postoperative course.  However, yesterday she developed urinary retention and a Foley catheter was placed without any return.  The catheter was upsized with return of large amount of bloody urine.  Continues to have bloody output but it has been draining well.  Past Medical History:  Diagnosis Date   ADHD (attention deficit hyperactivity disorder)    Anxiety    Depression    Dyspnea    Goiter    Physical growth delay    Poor appetite    Past Surgical History:  Procedure Laterality Date   LITHOTRIPSY Bilateral    dec 2023   TONSILLECTOMY AND ADENOIDECTOMY     Adenoids only   WISDOM TOOTH EXTRACTION Bilateral    2021    Home Medications:   Allergies:  Allergies  Allergen Reactions   Other Anaphylaxis, Swelling and Other (See Comments)    Mint    Family History  Problem Relation Age of Onset   Diabetes Mother    Diabetes Brother        Type 1   ADD / ADHD Brother    Anxiety disorder Brother    Depression Brother    Cancer Maternal Grandmother 57       Breast cancer   Cancer Maternal Grandfather    Social History:  reports that she has never smoked. She has never used smokeless tobacco. She reports that she does not drink alcohol and does not use drugs.  ROS: A complete review of systems was performed.  All systems are negative except for pertinent findings as noted. ROS   Physical Exam:  Vital signs in last 24 hours: Temp:  [99 F (37.2 C)] 99 F (37.2 C) (01/04 1605) Pulse Rate:  [96] 96 (01/04 1605) Resp:  [19] 19 (01/04 1605) BP: (122)/(74) 122/74 (01/04 1605) SpO2:  [98 %] 98 % (01/04 1605) General:  Alert and oriented, No acute distress HEENT: Normocephalic,  atraumatic Neck: No JVD or lymphadenopathy Cardiovascular: Regular rate and rhythm Lungs: Regular rate and effort Abdomen: Soft, nontender, nondistended, no abdominal masses Back: No CVA tenderness Genitourinary: Foley catheter draining dark red urine Extremities: No edema Neurologic: Grossly intact  Laboratory Data:  Results for orders placed or performed during the hospital encounter of 05/30/22 (from the past 24 hour(s))  Pregnancy, urine POC     Status: None   Collection Time: 05/30/22  4:03 PM  Result Value Ref Range   Preg Test, Ur NEGATIVE NEGATIVE   No results found for this or any previous visit (from the past 240 hour(s)). Creatinine: Recent Labs    05/29/22 1800  CREATININE 0.63    Impression/Assessment:  Gross hematuria Retained ureteral stents  Plan:  Will proceed with cystoscopy with clot evacuation with bilateral ureteral stent removal with bilateral retrograde pyelogram, possible stent exchange if any problems identified  Marton Redwood, III 05/30/2022, 5:07 PM

## 2022-05-30 NOTE — Anesthesia Preprocedure Evaluation (Signed)
Anesthesia Evaluation  Patient identified by MRN, date of birth, ID band Patient awake    Reviewed: Allergy & Precautions, NPO status , Patient's Chart, lab work & pertinent test results  Airway Mallampati: I       Dental no notable dental hx.    Pulmonary    Pulmonary exam normal        Cardiovascular negative cardio ROS Normal cardiovascular exam     Neuro/Psych negative neurological ROS     GI/Hepatic negative GI ROS, Neg liver ROS,,,  Endo/Other  negative endocrine ROS    Renal/GU   negative genitourinary   Musculoskeletal negative musculoskeletal ROS (+)    Abdominal Normal abdominal exam  (+)   Peds  Hematology  (+) Blood dyscrasia, anemia   Anesthesia Other Findings   Reproductive/Obstetrics negative OB ROS                             Anesthesia Physical Anesthesia Plan  ASA: 2  Anesthesia Plan: General   Post-op Pain Management:    Induction: Intravenous  PONV Risk Score and Plan: 4 or greater and Ondansetron, Dexamethasone and Midazolam  Airway Management Planned: LMA  Additional Equipment: None  Intra-op Plan:   Post-operative Plan: Extubation in OR  Informed Consent: I have reviewed the patients History and Physical, chart, labs and discussed the procedure including the risks, benefits and alternatives for the proposed anesthesia with the patient or authorized representative who has indicated his/her understanding and acceptance.     Dental advisory given  Plan Discussed with: CRNA  Anesthesia Plan Comments:        Anesthesia Quick Evaluation

## 2022-05-31 ENCOUNTER — Encounter (HOSPITAL_COMMUNITY): Payer: Self-pay | Admitting: Urology

## 2022-06-03 ENCOUNTER — Other Ambulatory Visit (HOSPITAL_COMMUNITY): Payer: Self-pay

## 2022-06-04 ENCOUNTER — Other Ambulatory Visit (HOSPITAL_COMMUNITY): Payer: Self-pay

## 2022-06-04 ENCOUNTER — Encounter (HOSPITAL_COMMUNITY): Payer: Self-pay

## 2022-06-06 ENCOUNTER — Other Ambulatory Visit (HOSPITAL_COMMUNITY): Payer: Self-pay

## 2022-06-06 ENCOUNTER — Other Ambulatory Visit: Payer: Self-pay

## 2022-07-08 ENCOUNTER — Other Ambulatory Visit (HOSPITAL_COMMUNITY): Payer: Self-pay

## 2022-08-13 ENCOUNTER — Other Ambulatory Visit (HOSPITAL_COMMUNITY): Payer: Self-pay

## 2022-12-11 ENCOUNTER — Encounter: Payer: Self-pay | Admitting: Family Medicine

## 2022-12-11 ENCOUNTER — Encounter: Payer: Self-pay | Admitting: *Deleted

## 2022-12-17 NOTE — Progress Notes (Signed)
Chief Complaint  Patient presents with   Establish Care    Getting adderall and propanolol filled up at school [facility]). Would you be able to take this over or would you prefer she continue to see doctor at school. Did see GYN one time, cannot remember who it was. Rx's OCP's and she did not even take them. No new concerns.    Patient presents to establish care.  She was supposed to have a complete physical, but arrived after her appointment time (rather than at least 15 mins early) Records from her pediatrician were reviewed prior to the visit. Not seen in a couple of years. Last family med visit in Epic was from 11/2020.  She has no specific concerns. She reports gap in care (noted above) was due to her father taking her and her sister in 2020, related to parents divorce. States he took control of everything.  She felt like he downgraded her concerns when wasn't feeling well, which ended up being issues with kidney stones, see below.  Her medical history was reviewed--  She has a history of depression, anxiety and ADHD. She reports she was put on Pristiq in middle school for depression. She reports she took it for several years. She reports a h/o cutting.  She feels like her depression comes and goes, not severe now.   She is under the care of a psychiatrist near her school ([employer] [place]), and has been seeing a therapist for 3 years. She feels like her anxiety is controlled with propranolol. She is on ADHD medications--Adderall 15mg  filled 6/19, 5/17, 5mg  short-acting adderall last filled 2/12. She is a Dispensing optician.  H/o kidney stones. First was at age 48. Under the care of Dr. Alvester Morin (urologist). Last stone was in 04/2022. She had bilateral ureteroscopy with laser lithotripsy and ureteral stent placement bilaterally. Ended up having urinary retention, went to ER, treated with foley. Had cystoscopy in 05/2022 with evacuation of clots and removal of stents. She hasn't had  any recurrent urinary issues.  Reports occ "phantom" pains, LLQ, where her stone was.  Denies hematuria. Denies urgency/frequency, dysuria. She is drinking plenty of water.  10/2021--saw GYN Dr. Rubye Oaks in [place] for intense cyclical pelvic pain.  Possible endometriosis was discussed.  She had pelvic US.  She was prescribed OCP's. She states she never started the OCP's. She states this was actually from a kidney stone. Periods have been much better, not as painful.  Psoriasis--rarely flares, mainly on R>L elbow.  Resolves on its own, hasn't needed any rx treatment.  Dermatographism--zyrtec didn't help that much, so doesn't take it anymore, just prn.  Hasn't needed any in a while.  Exercise-induced bronchospasm--diagnosed by Dr. Madie Reno. Was never treated with an inhaler. She takes long walks (2-3 miles), and at school.  Does notice some tightness in her chest, just usually when walking the hills when it is very cold out.  Anemia noted on last check (had kidney stone, blood clots in bladder)  Lab Results  Component Value Date   WBC 11.9 (H) 05/29/2022   HGB 10.8 (L) 05/29/2022   HCT 32.8 (L) 05/29/2022   MCV 90.4 05/29/2022   PLT 345 05/29/2022   Past Medical History:  Diagnosis Date   ADHD (attention deficit hyperactivity disorder)    Anxiety    Bell's palsy 03/2012   Depression    Dermatographism 2020   Dr. Madie Reno   Dyspnea    Exercise induced bronchospasm    Dr. Irena Cords  Goiter    Herpes labialis 05/2012   cold sores   Physical growth delay    Poor appetite    Psoriasis 2012   Dr. Emily Filbert   Past Surgical History:  Procedure Laterality Date   ADENOIDECTOMY     age 45 (for snoring)   CYSTOSCOPY WITH RETROGRADE PYELOGRAM, URETEROSCOPY AND STENT PLACEMENT Bilateral 05/30/2022   Procedure: CYSTOSCOPY WITH RETROGRADE PYELOGRAM, STENT REMOVAL;  Surgeon: Crista Elliot, MD;  Location: [facility];  Service: Urology;  Laterality: Bilateral;   LITHOTRIPSY Bilateral     dec 2023   WISDOM TOOTH EXTRACTION Bilateral    2021   Family History  Problem Relation Age of Onset   Depression Mother    Mental illness Mother    Mental illness Father        anger issues   Gallstones Father    Gallstones Sister    Eating disorder Sister    Diabetes Brother        Type 1   ADD / ADHD Brother    Anxiety disorder Brother    Depression Brother    Cancer Maternal Grandmother 72       Breast cancer   Kidney Stones Paternal Grandmother    Tremor Paternal Grandmother    Social History   Social History Narrative   Lives with mother and brother, 1 dog, 3 cats (1 cat is her own)   Mother's boyfriend lives there part time.   No contact with sister and Father.      Archivist at [employer], Health and safety inspector.   Works part-time at [employer] over the summer.      Updated 11/2022      Social History   Tobacco Use   Smoking status: Never   Smokeless tobacco: Never  Vaping Use   Vaping status: Never Used  Substance Use Topics   Alcohol use: Yes    Comment: Occasional glass of wine (usually with her mom, not at school)   Drug use: Yes    Types: Marijuana    Comment: edible MJ; since 04/2022; helped with pain of kidney stone (edible, legal)    Immunization History  Administered Date(s) Administered   DTaP 10/15/2002, 12/10/2002, 11/08/2003, 02/09/2004, 08/05/2006   HIB (PRP-OMP) 10/15/2002, 12/10/2002, 02/09/2003, 08/02/2003   HPV 9-valent 12/13/2020   Hepatitis A 08/01/2005, 07/07/2006   Hepatitis B 2002/08/27, 09/14/2002, 08/02/2003   IPV 10/15/2002, 12/10/2002, 05/17/2003, 08/05/2006   Influenza Nasal 01/03/2010   Influenza Split 02/09/2003, 03/24/2003, 02/09/2004, 06/13/2005, 04/28/2006, 06/30/2012   MMR 11/08/2003, 08/05/2006   MenQuadfi_Meningococcal Groups ACYW Conjugate 01/03/2014, 04/05/2019   Meningococcal B, OMV 04/05/2019, 01/27/2020   PFIZER Comirnaty(Gray Top)Covid-19 Tri-Sucrose Vaccine 09/03/2019, 09/28/2019   Pneumococcal Conjugate-13  10/15/2002, 12/10/2002, 02/09/2003, 07/05/2003   Tdap 01/03/2014   Varicella 08/02/2003, 08/05/2006    Outpatient Encounter Medications as of 12/18/2022  Medication Sig Note   amphetamine-dextroamphetamine (ADDERALL) 5 MG tablet Take 5 mg by mouth daily.    dextroamphetamine (DEXEDRINE SPANSULE) 15 MG 24 hr capsule Take 1 capsule by mouth every morning    amphetamine-dextroamphetamine (ADDERALL XR) 15 MG 24 hr capsule Take 1 capsule by mouth once a day*09/11/21 (Patient not taking: Reported on 05/30/2022)    dextroamphetamine (DEXTROSTAT) 5 MG tablet Take 1 tablet by mouth every evening as needed *take prior to night time college classes (fill 03/16/21) (Patient not taking: Reported on 05/30/2022)    ibuprofen (ADVIL) 200 MG tablet Take 200 mg by mouth every 6 (six) hours as needed (for pain). (  Patient not taking: Reported on 12/18/2022) 12/18/2022: As needed   propranolol (INDERAL) 10 MG tablet Take 1 tablet by mouth twice a day . (Patient not taking: Reported on 05/30/2022) 12/18/2022: As needed   [DISCONTINUED] amphetamine-dextroamphetamine (ADDERALL XR) 15 MG 24 hr capsule Take 1 capsule by mouth once a day (Patient not taking: Reported on 05/30/2022)    [DISCONTINUED] amphetamine-dextroamphetamine (ADDERALL) 5 MG tablet Take 1 tablet by mouth once a day in early afternoon if needed (Patient not taking: Reported on 05/30/2022)    [DISCONTINUED] amphetamine-dextroamphetamine (ADDERALL) 5 MG tablet Take 1 tablet by mouth once a day in early afternoon if needed*09/11/21 (Patient not taking: Reported on 05/30/2022)    [DISCONTINUED] amphetamine-dextroamphetamine (ADDERALL) 5 MG tablet Take 1 tablet by mouth once a day in early afternoon if needed (Patient not taking: Reported on 05/30/2022)    [DISCONTINUED] amphetamine-dextroamphetamine (ADDERALL) 5 MG tablet Take 1 tablet by mouth once a day*02/15/22 (Patient not taking: Reported on 05/30/2022)    [DISCONTINUED] amphetamine-dextroamphetamine (ADDERALL) 5 MG tablet  Take 1 tablet by mouth once a day*03/15/22 (Patient not taking: Reported on 05/30/2022)    [DISCONTINUED] amphetamine-dextroamphetamine (ADDERALL) 5 MG tablet Take 1 tablet by mouth once a day (Patient not taking: Reported on 05/30/2022)    [DISCONTINUED] amphetamine-dextroamphetamine (ADDERALL) 5 MG tablet Take 1 tablet by mouth once a day (Patient not taking: Reported on 05/30/2022)    [DISCONTINUED] amphetamine-dextroamphetamine (ADDERALL) 5 MG tablet Take 1 tablet by mouth once a day (Patient not taking: Reported on 05/30/2022)    [DISCONTINUED] amphetamine-dextroamphetamine (ADDERALL) 5 MG tablet Take 1 tablet by mouth once a day (Patient not taking: Reported on 05/30/2022)    [DISCONTINUED] cephALEXin (KEFLEX) 500 MG capsule Take 1 capsule (500 mg total) by mouth 3 (three) times daily. (Patient not taking: Reported on 05/30/2022)    [DISCONTINUED] cloNIDine (CATAPRES) 0.1 MG tablet Take 2 tablets by mouth every morning (Patient not taking: Reported on 05/30/2022)    [DISCONTINUED] cloNIDine HCl (KAPVAY) 0.1 MG TB12 ER tablet TAKE 1 TABLET BY MOUTH EACH MORNING (Patient not taking: Reported on 05/30/2022)    [DISCONTINUED] dextroamphetamine (DEXEDRINE SPANSULE) 15 MG 24 hr capsule Take 1 capsule (15 mg total) by mouth every morning. (7/29) (Patient not taking: Reported on 05/30/2022)    [DISCONTINUED] dextroamphetamine (DEXEDRINE SPANSULE) 15 MG 24 hr capsule Take 1 capsule by mouth every morning (Patient not taking: Reported on 05/30/2022)    [DISCONTINUED] dextroamphetamine (DEXEDRINE SPANSULE) 15 MG 24 hr capsule Take 1 capsule by mouth every morning (Patient not taking: Reported on 05/30/2022)    [DISCONTINUED] dextroamphetamine (DEXEDRINE SPANSULE) 15 MG 24 hr capsule Take 1 capsule by mouth once a day (Patient not taking: Reported on 05/30/2022)    [DISCONTINUED] dextroamphetamine (DEXEDRINE SPANSULE) 15 MG 24 hr capsule Take 1 capsule by mouth once a day*03/15/22 (Patient not taking: Reported on 05/30/2022)     [DISCONTINUED] dextroamphetamine (DEXEDRINE SPANSULE) 15 MG 24 hr capsule Take 1 capsule by mouth once a day*02/15/22 (Patient not taking: Reported on 05/30/2022)    [DISCONTINUED] dextroamphetamine (DEXEDRINE SPANSULE) 15 MG 24 hr capsule Take 1 capsule by mouth once a day (Patient not taking: Reported on 05/30/2022)    [DISCONTINUED] dextroamphetamine (DEXEDRINE SPANSULE) 15 MG 24 hr capsule Take 1 capsule (15 mg total) by mouth daily.    [DISCONTINUED] dextroamphetamine (DEXEDRINE SPANSULE) 15 MG 24 hr capsule Take 1 capsule by mouth once a day (Patient not taking: Reported on 05/30/2022)    [DISCONTINUED] dextroamphetamine (DEXEDRINE) 15 MG 24 hr capsule Take 1  capsule by mouth once a day (Patient not taking: Reported on 05/30/2022)    [DISCONTINUED] dextroamphetamine (DEXEDRINE) 15 MG 24 hr capsule Take 1 capsule by mouth once a day (Patient not taking: Reported on 05/30/2022)    [DISCONTINUED] dextroamphetamine (DEXEDRINE) 15 MG 24 hr capsule Take 1 capsule by mouth once a day *10/25/2021 (Patient not taking: Reported on 05/30/2022)    [DISCONTINUED] dextroamphetamine (DEXEDRINE) 15 MG 24 hr capsule Take 1 capsule by mouth once a day*12/25/21 (Patient not taking: Reported on 05/30/2022)    [DISCONTINUED] dextroamphetamine (DEXEDRINE) 15 MG 24 hr capsule Take 1 capsule by mouth once a day (Patient not taking: Reported on 05/30/2022)    [DISCONTINUED] dextroamphetamine (DEXEDRINE) 15 MG 24 hr capsule Take 1 capsule by mouth once a day (Patient not taking: Reported on 05/30/2022)    [DISCONTINUED] dextroamphetamine (DEXTROSTAT) 5 MG tablet Take 1 tablet by mouth every evening as needed *take prior to night time college classes (Patient not taking: Reported on 05/30/2022)    [DISCONTINUED] dextroamphetamine (DEXTROSTAT) 5 MG tablet Take 1 tablet by mouth every evening as needed prior to night time college classes (Patient not taking: Reported on 05/30/2022)    [DISCONTINUED] dextroamphetamine (DEXTROSTAT) 5 MG tablet Take 1  tablet by mouth every evening as needed take prior to night time college classes (Patient not taking: Reported on 05/30/2022)    [DISCONTINUED] drospirenone-ethinyl estradiol (YAZ) 3-0.02 MG tablet Take 1 tablet by mouth daily. (Patient not taking: Reported on 05/30/2022)    [DISCONTINUED] HYDROcodone-acetaminophen (NORCO/VICODIN) 5-325 MG tablet Take 1 tablet by mouth every 6 (six) hours as needed for pain.    [DISCONTINUED] propranolol (INDERAL) 10 MG tablet Take 1 tablet by mouth twice a day may take additional 1-2 tablets every 8 hours as needed for anxiety. (Patient not taking: Reported on 05/30/2022)    [DISCONTINUED] tamsulosin (FLOMAX) 0.4 MG CAPS capsule Take 1 capsule (0.4 mg total) by mouth daily.    No facility-administered encounter medications on file as of 12/18/2022.   (Per PDMP, hasn't filled dexedrine recently, just adderall) Allergies  Allergen Reactions   Other Anaphylaxis, Swelling and Other (See Comments)    Mint  Throat feels swollen, never got hives. Benadryl was effective.    ROS: No fever, chills, URI symptoms. No dizziness (only when very anxious), syncope. Occ HA after work. Chest tightness in cold weather with exercise. No GI or GU symptoms. Denies dysuria, hematuria, urgency/frequency "Typical" vaginal discharge--no changes; no odor, itch No joint pains--L pinkie goes numb if holding something for a long time. No bleeding/bruising or skin concerns. Psoriasis/rash per HPI    PHYSICAL EXAM:  BP 100/60   Pulse 76   Ht 4' 9.5" (1.461 m)   Wt 112 lb 9.6 oz (51.1 kg)   LMP 11/19/2022 (Approximate)   BMI 23.94 kg/m   Wt Readings from Last 3 Encounters:  12/18/22 112 lb 9.6 oz (51.1 kg)  05/29/22 110 lb 3.7 oz (50 kg) (15%, Z= -1.02)*  01/01/21 105 lb (47.6 kg) (10%, Z= -1.27)*   * Growth percentiles are based on CDC (Girls, 2-20 Years) data.   Pleasant, well-appearing female, in good spirits. Slightly anxious, apologizes a lot. HEENT: conjunctiva and  sclera are clear, EOMI. Neck: no lymphadenopathy, thyromegaly or mass Heart: regular rate and rhythm Lungs: clear bilaterally Abdomen: soft, nontender Extremities: no edema Skin: no visible rashes, normal turgor Neuro: alert and oriented, normal gait, cranial nerves grossly intact Psych: mildly anxious. Normal eye contact, speech, hygiene and grooming.  Full range of  affect.     12/18/2022    3:12 PM 12/13/2020   10:26 AM 09/20/2020   12:57 PM  Depression screen PHQ 2/9  Decreased Interest 1 0   Down, Depressed, Hopeless 2 1   PHQ - 2 Score 3 1   Altered sleeping 2 0   Tired, decreased energy 1 0   Change in appetite 2 0   Feeling bad or failure about yourself  2 1   Trouble concentrating 0 0   Moving slowly or fidgety/restless 2 0   Suicidal thoughts 1 0   PHQ-9 Score 13 2   Difficult doing work/chores Somewhat difficult Not difficult at all      Information is confidential and restricted. Go to Review Flowsheets to unlock data.   Only once had SI in the last few weeks.  No plan/intent/action.    ASSESSMENT/PLAN:  Encounter to establish care  Exercise induced bronchospasm Assessment & Plan: Has some mild symptoms with activity in the cold. Prescribed albuterol and she was educated on proper use. To contact us for aerochamber if having difficulty using it properly.  Side effects reviewed  Orders: -     Albuterol Sulfate HFA; Inhale 2 puffs into the lungs every 6 (six) hours as needed for wheezing or shortness of breath.  Dispense: 18 g; Refill: 1  Abnormal urinalysis -     Urine Culture  Attention deficit hyperactivity disorder (ADHD), predominantly inattentive type Assessment & Plan: Managed with adderall (short and long-acting) by her psychiatrist near her school.   Anxiety and depression Assessment & Plan: Under care of therapist and psychiatrist.  Only on propranolol, for anxiety. Not on anti-depressant. PHQ-9 score is high, encouraged her to f/u with psych,  and continue her therapy.  No SI currently, and when she previously did, there was no plan/inent.   Kidney stones Assessment & Plan: Currently asymptomatic, though reports some "phantom pains", occ pains at LLQ.  Will recheck urine  Orders: -     POCT Urinalysis DIP (Proadvantage Device)  Vaccine counseling   Discussed HPV vaccine--she has only had 1. Offered 2nd today, advised that she needs 2 more.  She prefers to wait and get it at [facility] (which needs to be scheduled). Due for Tdap next year (vs at [facility]). Encouraged flu shot and COVID booster in the Fall  Also discussed marijuana use, other relaxation techniques to use in the evenings.  To r/s [facility], next available (likely when home on Christmas break)  60 min FTF, with additional 30+ minutes of record review and documentation.

## 2022-12-18 ENCOUNTER — Encounter: Payer: Self-pay | Admitting: Family Medicine

## 2022-12-18 ENCOUNTER — Ambulatory Visit: Payer: BC Managed Care – PPO | Admitting: Family Medicine

## 2022-12-18 VITALS — BP 100/60 | HR 76 | Ht <= 58 in | Wt 112.6 lb

## 2022-12-18 DIAGNOSIS — F900 Attention-deficit hyperactivity disorder, predominantly inattentive type: Secondary | ICD-10-CM

## 2022-12-18 DIAGNOSIS — J45990 Exercise induced bronchospasm: Secondary | ICD-10-CM

## 2022-12-18 DIAGNOSIS — F32A Depression, unspecified: Secondary | ICD-10-CM

## 2022-12-18 DIAGNOSIS — F419 Anxiety disorder, unspecified: Secondary | ICD-10-CM | POA: Diagnosis not present

## 2022-12-18 DIAGNOSIS — R8290 Unspecified abnormal findings in urine: Secondary | ICD-10-CM | POA: Diagnosis not present

## 2022-12-18 DIAGNOSIS — Z7185 Encounter for immunization safety counseling: Secondary | ICD-10-CM | POA: Diagnosis not present

## 2022-12-18 DIAGNOSIS — Z7689 Persons encountering health services in other specified circumstances: Secondary | ICD-10-CM

## 2022-12-18 DIAGNOSIS — N200 Calculus of kidney: Secondary | ICD-10-CM | POA: Diagnosis not present

## 2022-12-18 LAB — POCT URINALYSIS DIP (PROADVANTAGE DEVICE)
Bilirubin, UA: NEGATIVE
Glucose, UA: NEGATIVE mg/dL
Ketones, POC UA: NEGATIVE mg/dL
Nitrite, UA: NEGATIVE
Protein Ur, POC: NEGATIVE mg/dL
Specific Gravity, Urine: 1.02
Urobilinogen, Ur: 0.2
pH, UA: 6.5 (ref 5.0–8.0)

## 2022-12-18 MED ORDER — ALBUTEROL SULFATE HFA 108 (90 BASE) MCG/ACT IN AERS: 2.0000 | INHALATION_SPRAY | Freq: Four times a day (QID) | RESPIRATORY_TRACT | 1 refills | Status: AC | PRN

## 2022-12-18 NOTE — Patient Instructions (Signed)
Try covering your mouth (with a scarf, gator, mask, etc) when you are walking/exercising in the very cold weather. This might prevent the tightness in the chest and shortness of breath.  We are giving you an albuterol inhaler to use if needed for chest tightness, wheezing, shortness of breath. You can use this as a "rescue"--when you are having those symptoms. You can use it preventatively, but taking the puffs 15-20 minutes before known activities that will trigger the symptoms (ie prior walking/exercising in cold weather).  The medication works for 4-6 hours. The side effects of shakiness, faster pulse usually improve within 15-20 minutes.

## 2022-12-20 ENCOUNTER — Encounter: Payer: Self-pay | Admitting: Family Medicine

## 2022-12-20 DIAGNOSIS — N200 Calculus of kidney: Secondary | ICD-10-CM | POA: Insufficient documentation

## 2022-12-20 DIAGNOSIS — F419 Anxiety disorder, unspecified: Secondary | ICD-10-CM | POA: Insufficient documentation

## 2022-12-20 DIAGNOSIS — J45990 Exercise induced bronchospasm: Secondary | ICD-10-CM | POA: Insufficient documentation

## 2022-12-20 NOTE — Assessment & Plan Note (Signed)
Under care of therapist and psychiatrist.  Only on propranolol, for anxiety. Not on anti-depressant. PHQ-9 score is high, encouraged her to f/u with psych, and continue her therapy.  No SI currently, and when she previously did, there was no plan/inent.

## 2022-12-20 NOTE — Assessment & Plan Note (Signed)
Has some mild symptoms with activity in the cold. Prescribed albuterol and she was educated on proper use. To contact us for aerochamber if having difficulty using it properly.  Side effects reviewed

## 2022-12-20 NOTE — Assessment & Plan Note (Signed)
Currently asymptomatic, though reports some "phantom pains", occ pains at LLQ.  Will recheck urine

## 2022-12-20 NOTE — Assessment & Plan Note (Signed)
Managed with adderall (short and long-acting) by her psychiatrist near her school.

## 2022-12-25 ENCOUNTER — Telehealth: Payer: Self-pay | Admitting: *Deleted

## 2022-12-25 NOTE — Telephone Encounter (Signed)
Called patient to give her the culture results. Left detailed message.

## 2022-12-30 DIAGNOSIS — N200 Calculus of kidney: Secondary | ICD-10-CM | POA: Diagnosis not present

## 2023-01-02 DIAGNOSIS — N281 Cyst of kidney, acquired: Secondary | ICD-10-CM | POA: Diagnosis not present

## 2023-01-02 DIAGNOSIS — N200 Calculus of kidney: Secondary | ICD-10-CM | POA: Diagnosis not present

## 2023-02-27 DIAGNOSIS — F4325 Adjustment disorder with mixed disturbance of emotions and conduct: Secondary | ICD-10-CM | POA: Diagnosis not present

## 2023-03-06 DIAGNOSIS — F4325 Adjustment disorder with mixed disturbance of emotions and conduct: Secondary | ICD-10-CM | POA: Diagnosis not present

## 2023-03-20 DIAGNOSIS — F4325 Adjustment disorder with mixed disturbance of emotions and conduct: Secondary | ICD-10-CM | POA: Diagnosis not present

## 2023-03-24 DIAGNOSIS — F4324 Adjustment disorder with disturbance of conduct: Secondary | ICD-10-CM | POA: Diagnosis not present

## 2023-03-26 DIAGNOSIS — F4324 Adjustment disorder with disturbance of conduct: Secondary | ICD-10-CM | POA: Diagnosis not present

## 2023-03-27 DIAGNOSIS — F4325 Adjustment disorder with mixed disturbance of emotions and conduct: Secondary | ICD-10-CM | POA: Diagnosis not present

## 2023-03-31 DIAGNOSIS — F4324 Adjustment disorder with disturbance of conduct: Secondary | ICD-10-CM | POA: Diagnosis not present

## 2023-04-02 DIAGNOSIS — F4325 Adjustment disorder with mixed disturbance of emotions and conduct: Secondary | ICD-10-CM | POA: Diagnosis not present

## 2023-04-03 DIAGNOSIS — F4325 Adjustment disorder with mixed disturbance of emotions and conduct: Secondary | ICD-10-CM | POA: Diagnosis not present

## 2023-04-05 ENCOUNTER — Other Ambulatory Visit: Payer: Self-pay | Admitting: Family Medicine

## 2023-04-05 DIAGNOSIS — J45990 Exercise induced bronchospasm: Secondary | ICD-10-CM

## 2023-04-07 DIAGNOSIS — F4325 Adjustment disorder with mixed disturbance of emotions and conduct: Secondary | ICD-10-CM | POA: Diagnosis not present

## 2023-04-09 DIAGNOSIS — F4325 Adjustment disorder with mixed disturbance of emotions and conduct: Secondary | ICD-10-CM | POA: Diagnosis not present

## 2023-04-10 DIAGNOSIS — F4325 Adjustment disorder with mixed disturbance of emotions and conduct: Secondary | ICD-10-CM | POA: Diagnosis not present

## 2023-04-11 DIAGNOSIS — F4325 Adjustment disorder with mixed disturbance of emotions and conduct: Secondary | ICD-10-CM | POA: Diagnosis not present

## 2023-04-14 DIAGNOSIS — F4325 Adjustment disorder with mixed disturbance of emotions and conduct: Secondary | ICD-10-CM | POA: Diagnosis not present

## 2023-04-16 DIAGNOSIS — F4325 Adjustment disorder with mixed disturbance of emotions and conduct: Secondary | ICD-10-CM | POA: Diagnosis not present

## 2023-04-17 DIAGNOSIS — F4325 Adjustment disorder with mixed disturbance of emotions and conduct: Secondary | ICD-10-CM | POA: Diagnosis not present

## 2023-04-18 DIAGNOSIS — F4325 Adjustment disorder with mixed disturbance of emotions and conduct: Secondary | ICD-10-CM | POA: Diagnosis not present

## 2023-04-21 DIAGNOSIS — F4325 Adjustment disorder with mixed disturbance of emotions and conduct: Secondary | ICD-10-CM | POA: Diagnosis not present

## 2023-04-23 DIAGNOSIS — F4325 Adjustment disorder with mixed disturbance of emotions and conduct: Secondary | ICD-10-CM | POA: Diagnosis not present

## 2023-04-24 DIAGNOSIS — F4325 Adjustment disorder with mixed disturbance of emotions and conduct: Secondary | ICD-10-CM | POA: Diagnosis not present

## 2023-04-25 DIAGNOSIS — F4325 Adjustment disorder with mixed disturbance of emotions and conduct: Secondary | ICD-10-CM | POA: Diagnosis not present

## 2023-04-28 DIAGNOSIS — F4325 Adjustment disorder with mixed disturbance of emotions and conduct: Secondary | ICD-10-CM | POA: Diagnosis not present

## 2023-04-28 DIAGNOSIS — M79601 Pain in right arm: Secondary | ICD-10-CM | POA: Diagnosis not present

## 2023-04-30 DIAGNOSIS — M79601 Pain in right arm: Secondary | ICD-10-CM | POA: Diagnosis not present

## 2023-05-01 DIAGNOSIS — F4325 Adjustment disorder with mixed disturbance of emotions and conduct: Secondary | ICD-10-CM | POA: Diagnosis not present

## 2023-05-05 DIAGNOSIS — F4325 Adjustment disorder with mixed disturbance of emotions and conduct: Secondary | ICD-10-CM | POA: Diagnosis not present

## 2023-05-05 DIAGNOSIS — M79601 Pain in right arm: Secondary | ICD-10-CM | POA: Diagnosis not present

## 2023-05-07 ENCOUNTER — Encounter: Payer: BC Managed Care – PPO | Admitting: Family Medicine

## 2023-05-07 DIAGNOSIS — M79601 Pain in right arm: Secondary | ICD-10-CM | POA: Diagnosis not present

## 2023-05-08 DIAGNOSIS — F4325 Adjustment disorder with mixed disturbance of emotions and conduct: Secondary | ICD-10-CM | POA: Diagnosis not present

## 2023-05-12 DIAGNOSIS — F4325 Adjustment disorder with mixed disturbance of emotions and conduct: Secondary | ICD-10-CM | POA: Diagnosis not present

## 2023-05-13 NOTE — Progress Notes (Unsigned)
No chief complaint on file.  Faith Shaw is a 22 y.o. female who presents for a complete physical.     Depression, anxiety and ADHD.  She remains under the care of a psychiatrist near [facility], and has been seeing a therapist for 3 years. Previously took Dentist for several years, in middle school, for depression. She reports a h/o cutting.   She feels like her depression comes and goes, reported as "not severe" as her establish care visit in July.  At that visit, she was only on propranolol for anxiety, not on anti-depressant. PHQ-9 score was high (13), and she was encouraged to f/u with psych, and continue her therapy.   She feels like her anxiety is controlled with propranolol. She is on ADHD medications per psychiatrist near school    H/o kidney stones. First was at age 19. Under the care of Dr. Alvester Morin (urologist). Last stone was in 04/2022. She had bilateral ureteroscopy with laser lithotripsy and ureteral stent placement bilaterally. Ended up having urinary retention, went to ER, treated with foley. Had cystoscopy in 05/2022 with evacuation of clots and removal of stents. She hasn't had any recurrent urinary issues.   Reports occ "phantom" pains, LLQ, where her stone was.  Denies hematuria. Denies urgency/frequency, dysuria. She is drinking plenty of water.   10/2021--saw GYN Dr. Rubye Oaks in [place] for intense cyclical pelvic pain.  Possible endometriosis was discussed.  She had pelvic US.  She was prescribed OCP's. She states she never started the OCP's. She states this was actually from a kidney stone. Periods have been much better, not as painful.   Psoriasis--rarely flares, mainly on R>L elbow.  Resolves on its own, hasn't needed any rx treatment.   H/o Dermatographism--zyrtec didn't help that much, so doesn't take it anymore, just prn.  Hasn't needed any in a while.   Exercise-induced bronchospasm--diagnosed by Dr. Madie Reno. At her visit in July she reported  noting some tightness in her chest when walking hills when it is very cold out. She was prescribed albuterol to use prn at that visit. UPDATE ***    Anemia noted on last check (had kidney stone, blood clots in bladder).  Lab Results  Component Value Date   WBC 11.9 (H) 05/29/2022   HGB 10.8 (L) 05/29/2022   HCT 32.8 (L) 05/29/2022   MCV 90.4 05/29/2022   PLT 345 05/29/2022     Immunization History  Administered Date(s) Administered   DTaP 10/15/2002, 12/10/2002, 11/08/2003, 02/09/2004, 08/05/2006   HIB (PRP-OMP) 10/15/2002, 12/10/2002, 02/09/2003, 08/02/2003   HPV 9-valent 12/13/2020   Hepatitis A 08/01/2005, 07/07/2006   Hepatitis B February 09, 2003, 09/14/2002, 08/02/2003   IPV 10/15/2002, 12/10/2002, 05/17/2003, 08/05/2006   Influenza Nasal 01/03/2010   Influenza Split 02/09/2003, 03/24/2003, 02/09/2004, 06/13/2005, 04/28/2006, 06/30/2012   MMR 11/08/2003, 08/05/2006   MenQuadfi_Meningococcal Groups ACYW Conjugate 01/03/2014, 04/05/2019   Meningococcal B, OMV 04/05/2019, 01/27/2020   PFIZER Comirnaty(Gray Top)Covid-19 Tri-Sucrose Vaccine 09/03/2019, 09/28/2019   Pneumococcal Conjugate-13 10/15/2002, 12/10/2002, 02/09/2003, 07/05/2003   Tdap 01/03/2014   Varicella 08/02/2003, 08/05/2006   Last Pap smear: never Sexually activity: *** Tobacco: *** Alcohol: *** Drugs: *** Dentist: Ophtho: Exercise:  Grades School: [employer], Investment banker, corporate major.  Lipid screen: Lab Results  Component Value Date   CHOL 153 04/05/2019   HDL 49 04/05/2019   LDLCALC 87 04/05/2019   TRIG 85 04/05/2019   Thyroid screen: Lab Results  Component Value Date   TSH 2.33 12/13/2020      PMH, PSH, SH,  and FH were reviewed and updated   Allergies  Allergen Reactions   Other Anaphylaxis, Swelling and Other (See Comments)    Mint   Throat feels swollen, never got hives. Benadryl was effective.   Review of Systems  Constitutional: Negative.  Negative for chills,  fever and weight loss.  HENT:  Negative for congestion and hearing loss.   Eyes: Negative.   Respiratory:  Negative for cough, shortness of breath and wheezing.   Cardiovascular:  Negative for chest pain, palpitations and leg swelling.  Gastrointestinal:  Negative for abdominal pain, blood in stool, constipation, diarrhea, melena, nausea and vomiting.  Genitourinary:  Negative for dysuria, frequency, hematuria and urgency.  Musculoskeletal:  Negative for joint pain.  Skin:  Negative for rash.  Neurological:  Negative for dizziness, tingling, tremors, weakness and headaches.  Endo/Heme/Allergies:  Negative for environmental allergies. Does not bruise/bleed easily.  Psychiatric/Behavioral:  Negative for depression and memory loss. The patient is not nervous/anxious and does not have insomnia.    ***UPDATE ALL ROS Psoriasis/rash? Shortness of breath? L pinkie goes numb if holding something for a long time   PHYSICAL EXAM:  There were no vitals taken for this visit.  Wt Readings from Last 3 Encounters:  12/18/22 112 lb 9.6 oz (51.1 kg)  05/29/22 110 lb 3.7 oz (50 kg) (15%, Z= -1.02)*  01/01/21 105 lb (47.6 kg) (10%, Z= -1.27)*   * Growth percentiles are based on CDC (Girls, 2-20 Years) data.    General Appearance:    Alert, cooperative, no distress, appears stated age  Head:    Normocephalic, without obvious abnormality, atraumatic  Eyes:    PERRL, conjunctiva/corneas clear, EOM's intact, fundi    benign  Ears:    Normal TM's and external ear canals  Nose:   Nares normal, mucosa normal, no drainage or sinus   tenderness  Throat:   Lips, mucosa, and tongue normal; teeth and gums normal  Neck:   Supple, no lymphadenopathy;  thyroid:  no   enlargement/tenderness/nodules; no carotid   bruit or JVD  Back:    Spine nontender, no curvature, ROM normal, no CVA     tenderness  Lungs:     Clear to auscultation bilaterally without wheezes, rales or     ronchi; respirations unlabored  Chest  Wall:    No tenderness or deformity   Heart:    Regular rate and rhythm, S1 and S2 normal, no murmur, rub   or gallop  Breast Exam:    No tenderness, masses, or nipple discharge or inversion.      No axillary lymphadenopathy  Abdomen:     Soft, non-tender, nondistended, normoactive bowel sounds,    no masses, no hepatosplenomegaly  Genitalia:    Normal external genitalia without lesions.  BUS and vagina normal; no cervical motion tenderness. No abnormal vaginal discharge.  Uterus and adnexa not enlarged, nontender, no masses.  Pap not performed  Rectal:    Not performed due to age<40 and no related complaints  Extremities:   No clubbing, cyanosis or edema  Pulses:   2+ and symmetric all extremities  Skin:   Skin color, texture, turgor normal, no rashes or lesions  Lymph nodes:   Cervical, supraclavicular, and axillary nodes normal  Neurologic:   CNII-XII intact, normal strength, sensation and gait; reflexes 2+ and symmetric throughout          Psych:   Normal mood, affect, hygiene and grooming.     ***UPDATE ALL   ASSESSMENT/PLAN:  Well child questions.  Flu, COVID? HPV--she declined in July, said she would get at CPE (only had 1. Needs 2 more.) TdaP now vs next year (due in August)  Cbc Consider D if not taking MVI or supplement    Discussed monthly self breast exams; at least 30 minutes of aerobic activity at least 5 days/week and weight-bearing exercise at least 2x/week; proper sunscreen use reviewed; avoidance of risky substances, safe sex; healthy diet, including goals of calcium and vitamin D intake; regular seatbelt use. Immunization recommendations discussed.

## 2023-05-14 ENCOUNTER — Encounter: Payer: Self-pay | Admitting: Family Medicine

## 2023-05-14 ENCOUNTER — Ambulatory Visit (INDEPENDENT_AMBULATORY_CARE_PROVIDER_SITE_OTHER): Payer: Medicaid Other | Admitting: Family Medicine

## 2023-05-14 VITALS — BP 118/74 | HR 84 | Ht <= 58 in | Wt 108.0 lb

## 2023-05-14 DIAGNOSIS — F419 Anxiety disorder, unspecified: Secondary | ICD-10-CM

## 2023-05-14 DIAGNOSIS — Z0000 Encounter for general adult medical examination without abnormal findings: Secondary | ICD-10-CM

## 2023-05-14 DIAGNOSIS — J45990 Exercise induced bronchospasm: Secondary | ICD-10-CM

## 2023-05-14 DIAGNOSIS — F900 Attention-deficit hyperactivity disorder, predominantly inattentive type: Secondary | ICD-10-CM | POA: Diagnosis not present

## 2023-05-14 DIAGNOSIS — E559 Vitamin D deficiency, unspecified: Secondary | ICD-10-CM

## 2023-05-14 DIAGNOSIS — Z23 Encounter for immunization: Secondary | ICD-10-CM

## 2023-05-14 DIAGNOSIS — F32A Depression, unspecified: Secondary | ICD-10-CM

## 2023-05-15 ENCOUNTER — Encounter: Payer: Self-pay | Admitting: Family Medicine

## 2023-05-15 DIAGNOSIS — E559 Vitamin D deficiency, unspecified: Secondary | ICD-10-CM | POA: Insufficient documentation

## 2023-05-15 DIAGNOSIS — F4325 Adjustment disorder with mixed disturbance of emotions and conduct: Secondary | ICD-10-CM | POA: Diagnosis not present

## 2023-05-15 LAB — CBC WITH DIFFERENTIAL/PLATELET
Basophils Absolute: 0 10*3/uL (ref 0.0–0.2)
Basos: 0 %
EOS (ABSOLUTE): 0.1 10*3/uL (ref 0.0–0.4)
Eos: 1 %
Hematocrit: 39.3 % (ref 34.0–46.6)
Hemoglobin: 13.2 g/dL (ref 11.1–15.9)
Immature Grans (Abs): 0 10*3/uL (ref 0.0–0.1)
Immature Granulocytes: 0 %
Lymphocytes Absolute: 2.6 10*3/uL (ref 0.7–3.1)
Lymphs: 37 %
MCH: 29.9 pg (ref 26.6–33.0)
MCHC: 33.6 g/dL (ref 31.5–35.7)
MCV: 89 fL (ref 79–97)
Monocytes Absolute: 0.6 10*3/uL (ref 0.1–0.9)
Monocytes: 8 %
Neutrophils Absolute: 3.8 10*3/uL (ref 1.4–7.0)
Neutrophils: 54 %
Platelets: 347 10*3/uL (ref 150–450)
RBC: 4.41 x10E6/uL (ref 3.77–5.28)
RDW: 12.4 % (ref 11.7–15.4)
WBC: 7 10*3/uL (ref 3.4–10.8)

## 2023-05-15 LAB — VITAMIN D 25 HYDROXY (VIT D DEFICIENCY, FRACTURES): Vit D, 25-Hydroxy: 15.1 ng/mL — ABNORMAL LOW (ref 30.0–100.0)

## 2023-05-15 MED ORDER — VITAMIN D (ERGOCALCIFEROL) 1.25 MG (50000 UNIT) PO CAPS: 50000.0000 [IU] | ORAL_CAPSULE | ORAL | 0 refills | Status: AC

## 2023-05-19 DIAGNOSIS — F4325 Adjustment disorder with mixed disturbance of emotions and conduct: Secondary | ICD-10-CM | POA: Diagnosis not present

## 2023-05-26 DIAGNOSIS — F4325 Adjustment disorder with mixed disturbance of emotions and conduct: Secondary | ICD-10-CM | POA: Diagnosis not present

## 2023-05-29 ENCOUNTER — Other Ambulatory Visit (INDEPENDENT_AMBULATORY_CARE_PROVIDER_SITE_OTHER): Payer: Medicaid Other

## 2023-05-29 DIAGNOSIS — Z23 Encounter for immunization: Secondary | ICD-10-CM

## 2023-05-29 DIAGNOSIS — F4325 Adjustment disorder with mixed disturbance of emotions and conduct: Secondary | ICD-10-CM | POA: Diagnosis not present

## 2023-06-02 DIAGNOSIS — F4325 Adjustment disorder with mixed disturbance of emotions and conduct: Secondary | ICD-10-CM | POA: Diagnosis not present

## 2023-06-04 DIAGNOSIS — F4325 Adjustment disorder with mixed disturbance of emotions and conduct: Secondary | ICD-10-CM | POA: Diagnosis not present

## 2023-06-09 DIAGNOSIS — F4325 Adjustment disorder with mixed disturbance of emotions and conduct: Secondary | ICD-10-CM | POA: Diagnosis not present

## 2023-06-11 DIAGNOSIS — F4325 Adjustment disorder with mixed disturbance of emotions and conduct: Secondary | ICD-10-CM | POA: Diagnosis not present

## 2023-06-17 DIAGNOSIS — F4325 Adjustment disorder with mixed disturbance of emotions and conduct: Secondary | ICD-10-CM | POA: Diagnosis not present

## 2023-06-18 DIAGNOSIS — F4325 Adjustment disorder with mixed disturbance of emotions and conduct: Secondary | ICD-10-CM | POA: Diagnosis not present

## 2023-06-20 DIAGNOSIS — F4325 Adjustment disorder with mixed disturbance of emotions and conduct: Secondary | ICD-10-CM | POA: Diagnosis not present

## 2023-06-25 DIAGNOSIS — F4325 Adjustment disorder with mixed disturbance of emotions and conduct: Secondary | ICD-10-CM | POA: Diagnosis not present

## 2023-06-26 DIAGNOSIS — F4325 Adjustment disorder with mixed disturbance of emotions and conduct: Secondary | ICD-10-CM | POA: Diagnosis not present

## 2023-07-02 DIAGNOSIS — F4325 Adjustment disorder with mixed disturbance of emotions and conduct: Secondary | ICD-10-CM | POA: Diagnosis not present

## 2023-07-04 DIAGNOSIS — F4325 Adjustment disorder with mixed disturbance of emotions and conduct: Secondary | ICD-10-CM | POA: Diagnosis not present

## 2023-07-08 DIAGNOSIS — F4325 Adjustment disorder with mixed disturbance of emotions and conduct: Secondary | ICD-10-CM | POA: Diagnosis not present

## 2023-07-30 DIAGNOSIS — F4325 Adjustment disorder with mixed disturbance of emotions and conduct: Secondary | ICD-10-CM | POA: Diagnosis not present

## 2023-08-07 ENCOUNTER — Other Ambulatory Visit: Payer: Self-pay | Admitting: Family Medicine

## 2023-08-07 DIAGNOSIS — E559 Vitamin D deficiency, unspecified: Secondary | ICD-10-CM

## 2023-08-08 NOTE — Telephone Encounter (Signed)
 Per lab visit. Dr. Lynelle Doctor only rx for 12 weeks then she would need to do OTC vitamin

## 2023-08-13 DIAGNOSIS — F4325 Adjustment disorder with mixed disturbance of emotions and conduct: Secondary | ICD-10-CM | POA: Diagnosis not present

## 2023-08-20 DIAGNOSIS — F4325 Adjustment disorder with mixed disturbance of emotions and conduct: Secondary | ICD-10-CM | POA: Diagnosis not present

## 2023-09-05 DIAGNOSIS — F4325 Adjustment disorder with mixed disturbance of emotions and conduct: Secondary | ICD-10-CM | POA: Diagnosis not present

## 2023-09-10 DIAGNOSIS — F4325 Adjustment disorder with mixed disturbance of emotions and conduct: Secondary | ICD-10-CM | POA: Diagnosis not present

## 2023-09-17 DIAGNOSIS — F4325 Adjustment disorder with mixed disturbance of emotions and conduct: Secondary | ICD-10-CM | POA: Diagnosis not present

## 2023-09-18 DIAGNOSIS — F4325 Adjustment disorder with mixed disturbance of emotions and conduct: Secondary | ICD-10-CM | POA: Diagnosis not present

## 2023-09-25 DIAGNOSIS — F4325 Adjustment disorder with mixed disturbance of emotions and conduct: Secondary | ICD-10-CM | POA: Diagnosis not present

## 2023-10-22 DIAGNOSIS — N281 Cyst of kidney, acquired: Secondary | ICD-10-CM | POA: Diagnosis not present

## 2023-10-22 DIAGNOSIS — N200 Calculus of kidney: Secondary | ICD-10-CM | POA: Diagnosis not present

## 2023-10-29 DIAGNOSIS — F4325 Adjustment disorder with mixed disturbance of emotions and conduct: Secondary | ICD-10-CM | POA: Diagnosis not present

## 2023-11-05 DIAGNOSIS — F4325 Adjustment disorder with mixed disturbance of emotions and conduct: Secondary | ICD-10-CM | POA: Diagnosis not present

## 2023-11-06 ENCOUNTER — Other Ambulatory Visit: Payer: Medicaid Other

## 2023-11-07 DIAGNOSIS — F4325 Adjustment disorder with mixed disturbance of emotions and conduct: Secondary | ICD-10-CM | POA: Diagnosis not present

## 2023-11-12 DIAGNOSIS — F4325 Adjustment disorder with mixed disturbance of emotions and conduct: Secondary | ICD-10-CM | POA: Diagnosis not present

## 2023-11-14 DIAGNOSIS — F4325 Adjustment disorder with mixed disturbance of emotions and conduct: Secondary | ICD-10-CM | POA: Diagnosis not present

## 2023-11-19 DIAGNOSIS — F4325 Adjustment disorder with mixed disturbance of emotions and conduct: Secondary | ICD-10-CM | POA: Diagnosis not present

## 2023-11-21 DIAGNOSIS — F4325 Adjustment disorder with mixed disturbance of emotions and conduct: Secondary | ICD-10-CM | POA: Diagnosis not present

## 2023-11-24 DIAGNOSIS — F4325 Adjustment disorder with mixed disturbance of emotions and conduct: Secondary | ICD-10-CM | POA: Diagnosis not present

## 2023-11-26 DIAGNOSIS — F4325 Adjustment disorder with mixed disturbance of emotions and conduct: Secondary | ICD-10-CM | POA: Diagnosis not present

## 2023-11-28 DIAGNOSIS — F4325 Adjustment disorder with mixed disturbance of emotions and conduct: Secondary | ICD-10-CM | POA: Diagnosis not present

## 2023-12-03 DIAGNOSIS — F4325 Adjustment disorder with mixed disturbance of emotions and conduct: Secondary | ICD-10-CM | POA: Diagnosis not present

## 2023-12-05 DIAGNOSIS — F4325 Adjustment disorder with mixed disturbance of emotions and conduct: Secondary | ICD-10-CM | POA: Diagnosis not present

## 2023-12-08 DIAGNOSIS — F4325 Adjustment disorder with mixed disturbance of emotions and conduct: Secondary | ICD-10-CM | POA: Diagnosis not present

## 2023-12-24 ENCOUNTER — Encounter: Payer: Self-pay | Admitting: Family Medicine

## 2024-01-21 DIAGNOSIS — N390 Urinary tract infection, site not specified: Secondary | ICD-10-CM | POA: Diagnosis not present

## 2024-01-21 DIAGNOSIS — R300 Dysuria: Secondary | ICD-10-CM | POA: Diagnosis not present

## 2024-02-25 DIAGNOSIS — F4325 Adjustment disorder with mixed disturbance of emotions and conduct: Secondary | ICD-10-CM | POA: Diagnosis not present

## 2024-02-27 ENCOUNTER — Telehealth: Payer: Self-pay

## 2024-02-27 NOTE — Telephone Encounter (Signed)
 I will do that now, Her message came through as a Patient call. When I spoke with her she said she was walking and couldn't get the ph. # and wasn't sure if she could get Mychart messages, So I will send an email. Thank you.

## 2024-02-27 NOTE — Telephone Encounter (Signed)
 Copied from CRM [id number]. Topic: Medical Record Request - Records Request >> Feb 27, 2024  1:52 PM Carlatta H wrote: Reason for CRM: Patient is requesting records be sent to her new address she has moved to [place] //   Called pt and gave number for medical records, also removed Dr. Randol as her Provider.

## 2024-03-03 DIAGNOSIS — F4325 Adjustment disorder with mixed disturbance of emotions and conduct: Secondary | ICD-10-CM | POA: Diagnosis not present

## 2024-03-10 DIAGNOSIS — F4325 Adjustment disorder with mixed disturbance of emotions and conduct: Secondary | ICD-10-CM | POA: Diagnosis not present

## 2024-03-17 DIAGNOSIS — F4325 Adjustment disorder with mixed disturbance of emotions and conduct: Secondary | ICD-10-CM | POA: Diagnosis not present

## 2024-03-24 DIAGNOSIS — F4325 Adjustment disorder with mixed disturbance of emotions and conduct: Secondary | ICD-10-CM | POA: Diagnosis not present

## 2024-03-31 DIAGNOSIS — F4325 Adjustment disorder with mixed disturbance of emotions and conduct: Secondary | ICD-10-CM | POA: Diagnosis not present

## 2024-04-07 DIAGNOSIS — F4325 Adjustment disorder with mixed disturbance of emotions and conduct: Secondary | ICD-10-CM | POA: Diagnosis not present

## 2024-04-13 DIAGNOSIS — F4325 Adjustment disorder with mixed disturbance of emotions and conduct: Secondary | ICD-10-CM | POA: Diagnosis not present

## 2024-05-17 ENCOUNTER — Encounter: Payer: Medicaid Other | Admitting: Family Medicine
